# Patient Record
Sex: Female | Born: 1960 | Race: White | Hispanic: No | Marital: Single | Smoking: Never smoker
Health system: Southern US, Community
[De-identification: ages and names within clinical notes are randomized; demographics above are authoritative.]

## PROBLEM LIST (undated history)

## (undated) DIAGNOSIS — E7800 Pure hypercholesterolemia, unspecified: Secondary | ICD-10-CM

## (undated) DIAGNOSIS — E119 Type 2 diabetes mellitus without complications: Secondary | ICD-10-CM

## (undated) DIAGNOSIS — I10 Essential (primary) hypertension: Secondary | ICD-10-CM

## (undated) DIAGNOSIS — K219 Gastro-esophageal reflux disease without esophagitis: Secondary | ICD-10-CM

## (undated) HISTORY — PX: TUBAL LIGATION: SHX77

## (undated) HISTORY — PX: ADENOIDECTOMY: SUR15

## (undated) HISTORY — PX: TONSILLECTOMY: SUR1361

## (undated) HISTORY — PX: DILATION AND CURETTAGE OF UTERUS: SHX78

---

## 2010-12-04 ENCOUNTER — Emergency Department: Payer: Self-pay | Admitting: Internal Medicine

## 2012-10-22 ENCOUNTER — Emergency Department: Payer: Self-pay | Admitting: Internal Medicine

## 2012-10-22 LAB — URINALYSIS, COMPLETE
Glucose,UR: NEGATIVE mg/dL (ref 0–75)
Ph: 9 (ref 4.5–8.0)
RBC,UR: 126 /HPF (ref 0–5)
Squamous Epithelial: 7
WBC UR: 59 /HPF (ref 0–5)

## 2012-10-31 DIAGNOSIS — Z0000 Encounter for general adult medical examination without abnormal findings: Secondary | ICD-10-CM | POA: Insufficient documentation

## 2012-10-31 DIAGNOSIS — E669 Obesity, unspecified: Secondary | ICD-10-CM | POA: Insufficient documentation

## 2012-10-31 DIAGNOSIS — Z1211 Encounter for screening for malignant neoplasm of colon: Secondary | ICD-10-CM | POA: Insufficient documentation

## 2012-11-16 ENCOUNTER — Ambulatory Visit: Payer: Self-pay

## 2013-06-13 DIAGNOSIS — J309 Allergic rhinitis, unspecified: Secondary | ICD-10-CM | POA: Insufficient documentation

## 2013-06-13 DIAGNOSIS — Z8679 Personal history of other diseases of the circulatory system: Secondary | ICD-10-CM | POA: Insufficient documentation

## 2013-06-13 DIAGNOSIS — I10 Essential (primary) hypertension: Secondary | ICD-10-CM | POA: Insufficient documentation

## 2013-06-16 ENCOUNTER — Ambulatory Visit: Payer: Self-pay | Admitting: Family Medicine

## 2013-12-26 DIAGNOSIS — G479 Sleep disorder, unspecified: Secondary | ICD-10-CM | POA: Insufficient documentation

## 2014-01-24 DIAGNOSIS — C44310 Basal cell carcinoma of skin of unspecified parts of face: Secondary | ICD-10-CM | POA: Insufficient documentation

## 2014-02-05 ENCOUNTER — Ambulatory Visit: Payer: Self-pay | Admitting: Family Medicine

## 2014-03-24 ENCOUNTER — Emergency Department: Payer: Self-pay | Admitting: Emergency Medicine

## 2014-03-27 LAB — BETA STREP CULTURE(ARMC)

## 2014-05-08 DIAGNOSIS — Z85828 Personal history of other malignant neoplasm of skin: Secondary | ICD-10-CM | POA: Insufficient documentation

## 2015-08-16 ENCOUNTER — Other Ambulatory Visit: Payer: Self-pay | Admitting: Family Medicine

## 2015-08-16 DIAGNOSIS — Z1231 Encounter for screening mammogram for malignant neoplasm of breast: Secondary | ICD-10-CM

## 2015-12-06 ENCOUNTER — Ambulatory Visit: Payer: Self-pay

## 2015-12-16 ENCOUNTER — Encounter: Payer: Self-pay | Admitting: Radiology

## 2015-12-16 ENCOUNTER — Ambulatory Visit
Admission: RE | Admit: 2015-12-16 | Discharge: 2015-12-16 | Disposition: A | Discharge status: Home / Self Care | Payer: BLUE CROSS/BLUE SHIELD | Source: Ambulatory Visit | Attending: Family Medicine | Admitting: Family Medicine

## 2015-12-16 DIAGNOSIS — Z1231 Encounter for screening mammogram for malignant neoplasm of breast: Secondary | ICD-10-CM | POA: Diagnosis present

## 2016-11-25 ENCOUNTER — Other Ambulatory Visit: Payer: Self-pay | Admitting: Family Medicine

## 2016-11-25 DIAGNOSIS — Z1231 Encounter for screening mammogram for malignant neoplasm of breast: Secondary | ICD-10-CM

## 2017-03-05 ENCOUNTER — Ambulatory Visit
Admission: RE | Admit: 2017-03-05 | Discharge: 2017-03-05 | Disposition: A | Discharge status: Home / Self Care | Payer: BLUE CROSS/BLUE SHIELD | Source: Ambulatory Visit | Attending: Family Medicine | Admitting: Family Medicine

## 2017-03-05 DIAGNOSIS — Z1231 Encounter for screening mammogram for malignant neoplasm of breast: Secondary | ICD-10-CM | POA: Insufficient documentation

## 2018-01-27 ENCOUNTER — Other Ambulatory Visit: Payer: Self-pay | Admitting: Family Medicine

## 2018-01-27 DIAGNOSIS — Z1231 Encounter for screening mammogram for malignant neoplasm of breast: Secondary | ICD-10-CM

## 2018-03-08 ENCOUNTER — Ambulatory Visit
Admission: RE | Admit: 2018-03-08 | Discharge: 2018-03-08 | Disposition: A | Discharge status: Home / Self Care | Payer: BLUE CROSS/BLUE SHIELD | Source: Ambulatory Visit | Attending: Family Medicine | Admitting: Family Medicine

## 2018-03-08 DIAGNOSIS — Z1231 Encounter for screening mammogram for malignant neoplasm of breast: Secondary | ICD-10-CM | POA: Diagnosis present

## 2019-02-14 ENCOUNTER — Other Ambulatory Visit: Payer: Self-pay | Admitting: Family Medicine

## 2019-02-14 DIAGNOSIS — Z1231 Encounter for screening mammogram for malignant neoplasm of breast: Secondary | ICD-10-CM

## 2019-03-31 ENCOUNTER — Ambulatory Visit
Admission: RE | Admit: 2019-03-31 | Discharge: 2019-03-31 | Disposition: A | Discharge status: Home / Self Care | Payer: BLUE CROSS/BLUE SHIELD | Source: Ambulatory Visit | Attending: Family Medicine | Admitting: Family Medicine

## 2019-03-31 DIAGNOSIS — Z1231 Encounter for screening mammogram for malignant neoplasm of breast: Secondary | ICD-10-CM | POA: Diagnosis not present

## 2019-12-08 ENCOUNTER — Ambulatory Visit
Admission: EM | Admit: 2019-12-08 | Discharge: 2019-12-08 | Disposition: A | Discharge status: Home / Self Care | Payer: BLUE CROSS/BLUE SHIELD | Attending: Emergency Medicine | Admitting: Emergency Medicine

## 2019-12-08 ENCOUNTER — Other Ambulatory Visit: Payer: Self-pay

## 2019-12-08 DIAGNOSIS — J029 Acute pharyngitis, unspecified: Secondary | ICD-10-CM | POA: Diagnosis present

## 2019-12-08 DIAGNOSIS — R059 Cough, unspecified: Secondary | ICD-10-CM | POA: Insufficient documentation

## 2019-12-08 DIAGNOSIS — B349 Viral infection, unspecified: Secondary | ICD-10-CM | POA: Insufficient documentation

## 2019-12-08 DIAGNOSIS — Z20822 Contact with and (suspected) exposure to covid-19: Secondary | ICD-10-CM | POA: Diagnosis not present

## 2019-12-08 HISTORY — DX: Essential (primary) hypertension: I10

## 2019-12-08 HISTORY — DX: Type 2 diabetes mellitus without complications: E11.9

## 2019-12-08 HISTORY — DX: Pure hypercholesterolemia, unspecified: E78.00

## 2019-12-08 HISTORY — DX: Gastro-esophageal reflux disease without esophagitis: K21.9

## 2019-12-08 LAB — GROUP A STREP BY PCR: Group A Strep by PCR: NOT DETECTED

## 2019-12-08 MED ORDER — IPRATROPIUM BROMIDE 0.06 % NA SOLN: 2.0000 | Freq: Four times a day (QID) | NASAL | 0 refills | Status: DC

## 2019-12-08 MED ORDER — BENZONATATE 100 MG PO CAPS: 200.0000 mg | ORAL_CAPSULE | Freq: Three times a day (TID) | ORAL | 0 refills | Status: AC | PRN

## 2019-12-08 NOTE — Discharge Instructions (Addendum)
I will call with the results of the strep test & antibiotics if your strep test is positive.  As discussed, if you test positive for Covid I will call you tomorrow or the next day to inform you and I can also refer you to the infusion clinic for antibody therapy.  URI/COLD SYMPTOMS: Your exam today is consistent with a viral illness. Antibiotics are not indicated at this time. Use medications as directed, including cough syrup, nasal saline, and decongestants. Your symptoms should improve over the next few days and resolve within 7-10 days. Increase rest and fluids. F/u if symptoms worsen or predominate such as sore throat, ear pain, productive cough, shortness of breath, or if you develop high fevers or worsening fatigue over the next several days.    You have received COVID testing today either for positive exposure, concerning symptoms that could be related to COVID infection, screening purposes, or re-testing after confirmed positive.  Your test obtained today checks for active viral infection in the last 1-2 weeks. If your test is negative now, you can still test positive later. So, if you do develop symptoms you should either get re-tested and/or isolate x 10 days. Please follow CDC guidelines.  While Rapid antigen tests come back in 15-20 minutes, send out PCR/molecular test results typically come back within 24 hours. In the mean time, if you are symptomatic, assume this could be a positive test and treat/monitor yourself as if you do have COVID.   We will call with test results. Please download the MyChart app and set up a profile to access test results.   If symptomatic, go home and rest. Push fluids. Take Tylenol as needed for discomfort. Gargle warm salt water. Throat lozenges. Take Mucinex DM or Robitussin for cough. Humidifier in bedroom to ease coughing. Warm showers. Also review the COVID handout for more information.  COVID-19 INFECTION: The incubation period of COVID-19 is  approximately 14 days after exposure, with most symptoms developing in roughly 4-5 days. Symptoms may range in severity from mild to critically severe. Roughly 80% of those infected will have mild symptoms. People of any age may become infected with COVID-19 and have the ability to transmit the virus. The most common symptoms include: fever, fatigue, cough, body aches, headaches, sore throat, nasal congestion, shortness of breath, nausea, vomiting, diarrhea, changes in smell and/or taste.    COURSE OF ILLNESS Some patients may begin with mild disease which can progress quickly into critical symptoms. If your symptoms are worsening please call ahead to the Emergency Department and proceed there for further treatment. Recovery time appears to be roughly 1-2 weeks for mild symptoms and 3-6 weeks for severe disease.   GO IMMEDIATELY TO ER FOR FEVER YOU ARE UNABLE TO GET DOWN WITH TYLENOL, BREATHING PROBLEMS, CHEST PAIN, FATIGUE, LETHARGY, INABILITY TO EAT OR DRINK, ETC  QUARANTINE AND ISOLATION: To help decrease the spread of COVID-19 please remain isolated if you have COVID infection or are highly suspected to have COVID infection. This means -stay home and isolate to one room in the home if you live with others. Do not share a bed or bathroom with others while ill, sanitize and wipe down all countertops and keep common areas clean and disinfected. You may discontinue isolation if you have a mild case and are asymptomatic 10 days after symptom onset as long as you have been fever free >24 hours without having to take Motrin or Tylenol. If your case is more severe (meaning you develop pneumonia  or are admitted in the hospital), you may have to isolate longer.   If you have been in close contact (within 6 feet) of someone diagnosed with COVID 19, you are advised to quarantine in your home for 14 days as symptoms can develop anywhere from 2-14 days after exposure to the virus. If you develop symptoms, you  must  isolate.  Most current guidelines for COVID after exposure -isolate 10 days if you ARE NOT tested for COVID as long as symptoms do not develop -isolate 7 days if you are tested and remain asymptomatic -You do not necessarily need to be tested for COVID if you have + exposure and        develop   symptoms. Just isolate at home x10 days from symptom onset During this global pandemic, CDC advises to practice social distancing, try to stay at least 44ft away from others at all times. Wear a face covering. Wash and sanitize your hands regularly and avoid going anywhere that is not necessary.  KEEP IN MIND THAT THE COVID TEST IS NOT 100% ACCURATE AND YOU SHOULD STILL DO EVERYTHING TO PREVENT POTENTIAL SPREAD OF VIRUS TO OTHERS (WEAR MASK, WEAR GLOVES, WASH HANDS AND SANITIZE REGULARLY). IF INITIAL TEST IS NEGATIVE, THIS MAY NOT MEAN YOU ARE DEFINITELY NEGATIVE. MOST ACCURATE TESTING IS DONE 5-7 DAYS AFTER EXPOSURE.   It is not advised by CDC to get re-tested after receiving a positive COVID test since you can still test positive for weeks to months after you have already cleared the virus.   *If you have not been vaccinated for COVID, I strongly suggest you consider getting vaccinated as long as there are no contraindications.

## 2019-12-08 NOTE — ED Triage Notes (Signed)
Pt reports being very sleepy two days ago and sleeping all day.  Yesterday developed sore throat and low grade temp.  Today reports sore throat, HA, cough, sinus congestion.  Took Tylenol around 0930 this morning.

## 2019-12-08 NOTE — ED Triage Notes (Signed)
Tried to reach patient for Triage. Phone # is not working.

## 2019-12-08 NOTE — ED Provider Notes (Signed)
[facility]    CSN: [id number] Arrival date & time: 12/08/19  1437      History   Chief Complaint Chief Complaint  Patient presents with  . Sore Throat    HPI Lauren Lyons is a 59 y.o. female presenting for 2-day history of feeling fatigued.  She states that she has had a sore throat and low-grade temperature since yesterday.  Today she admits to headaches, cough, sinus and nasal congestion and continued sore throat.  She states that she has taken Tylenol this morning.  Patient says that someone at her workplace was recently out with Covid.  Is fully vaccinated for Covid.  Patient has taken Tylenol for symptoms, but no other medications.  She denies chest pain, breathing difficulty, abdominal pain, nausea, vomiting, diarrhea.  Patient medical history significant for diabetes and hypertension.  Patient has no other complaints or concerns today.  HPI  Past Medical History:  Diagnosis Date  . Diabetes mellitus without complication (HCC)   . GERD (gastroesophageal reflux disease)   . High cholesterol   . Hypertension     There are no problems to display for this patient.   Past Surgical History:  Procedure Laterality Date  . ADENOIDECTOMY    . CESAREAN SECTION    . DILATION AND CURETTAGE OF UTERUS    . TONSILLECTOMY      OB History   No obstetric history on file.      Home Medications    Prior to Admission medications   Medication Sig Start Date End Date Taking? Authorizing Provider  atorvastatin (LIPITOR) 40 MG tablet Take 40 mg by mouth daily.   Yes [provider]  cetirizine (ZYRTEC) 10 MG tablet Take 10 mg by mouth daily.   Yes [provider]  lisinopril-hydrochlorothiazide (ZESTORETIC) 20-12.5 MG tablet Take 1 tablet by mouth daily.   Yes [provider]  Multiple Vitamin (MULTIVITAMIN) tablet Take 1 tablet by mouth daily.   Yes [provider]  omeprazole (PRILOSEC) 20 MG capsule Take 20 mg by mouth daily.    Yes [provider]  sitaGLIPtin (JANUVIA) 50 MG tablet Take 50 mg by mouth daily.   Yes [provider]  venlafaxine XR (EFFEXOR-XR) 150 MG 24 hr capsule Take 150 mg by mouth daily with breakfast.   Yes [provider]  benzonatate (TESSALON) 100 MG capsule Take 2 capsules (200 mg total) by mouth 3 (three) times daily as needed for up to 10 days for cough. 12/08/19 12/18/19  Eusebio Friendly B, PA-C  ipratropium (ATROVENT) 0.06 % nasal spray Place 2 sprays into both nostrils 4 (four) times daily for 10 days. 12/08/19 12/18/19  Shirlee Latch, PA-C    Family History Family History  Problem Relation Age of Onset  . Breast cancer Maternal Grandmother   . Breast cancer Cousin   . Hypertension Mother   . Diabetes Father     Social History Social History   Tobacco Use  . Smoking status: Never Smoker  . Smokeless tobacco: Never Used  Vaping Use  . Vaping Use: Never used  Substance Use Topics  . Alcohol use: Never  . Drug use: Never     Allergies   Patient has no known allergies.   Review of Systems Review of Systems  Constitutional: Positive for fatigue. Negative for chills, diaphoresis and fever.  HENT: Positive for congestion, rhinorrhea, sinus pressure and sore throat. Negative for ear pain and sinus pain.   Respiratory: Positive for cough. Negative  for shortness of breath.   Gastrointestinal: Negative for abdominal pain, nausea and vomiting.  Musculoskeletal: Negative for arthralgias and myalgias.  Skin: Negative for rash.  Neurological: Positive for headaches. Negative for weakness.  Hematological: Negative for adenopathy.     Physical Exam Triage Vital Signs ED Triage Vitals  Enc Vitals Group     BP 12/08/19 1724 (!) 155/86     Pulse Rate 12/08/19 1724 92     Resp 12/08/19 1724 18     Temp 12/08/19 1724 98.7 F (37.1 C)     Temp Source 12/08/19 1724 Oral     SpO2 12/08/19 1724 96 %     Weight --      Height --      Head Circumference  --      Peak Flow --      Pain Score 12/08/19 1728 5     Pain Loc --      Pain Edu? --      Excl. in GC? --    No data found.  Updated Vital Signs BP (!) 155/86 (BP Location: Left Arm)   Pulse 92   Temp 98.7 F (37.1 C) (Oral)   Resp 18   LMP 11/25/2015 (Approximate)   SpO2 96%       Physical Exam Vitals and nursing note reviewed.  Constitutional:      General: She is not in acute distress.    Appearance: Normal appearance. She is well-developed. She is obese. She is not ill-appearing or toxic-appearing.  HENT:     Head: Normocephalic and atraumatic.     Right Ear: Tympanic membrane and ear canal normal.     Left Ear: Tympanic membrane and ear canal normal.     Nose: Congestion present.     Mouth/Throat:     Mouth: Mucous membranes are moist.     Pharynx: Oropharynx is clear. Posterior oropharyngeal erythema (copius clear foamy PND) present.  Eyes:     General: No scleral icterus.       Right eye: No discharge.        Left eye: No discharge.     Conjunctiva/sclera: Conjunctivae normal.  Cardiovascular:     Rate and Rhythm: Normal rate and regular rhythm.     Heart sounds: Normal heart sounds.  Pulmonary:     Effort: Pulmonary effort is normal. No respiratory distress.     Breath sounds: Normal breath sounds.  Musculoskeletal:     Cervical back: Neck supple.  Skin:    General: Skin is dry.  Neurological:     General: No focal deficit present.     Mental Status: She is alert. Mental status is at baseline.     Motor: No weakness.     Gait: Gait normal.  Psychiatric:        Mood and Affect: Mood normal.        Behavior: Behavior normal.        Thought Content: Thought content normal.      UC Treatments / Results  Labs (all labs ordered are listed, but only abnormal results are displayed) Labs Reviewed  GROUP A STREP BY PCR  SARS CORONAVIRUS 2 (TAT 6-24 HRS)    EKG   Radiology No results found.  Procedures Procedures (including critical care  time)  Medications Ordered in UC Medications - No data to display  Initial Impression / Assessment and Plan / UC Course  I have reviewed the triage vital signs and the nursing notes.  Pertinent  labs & imaging results that were available during my care of the patient were reviewed by me and considered in my medical decision making (see chart for details).   Rapid strep is negative.  Covid test sent.  Discussed with patient access results.  CDC guidelines, isolation protocol and ED precautions discussed with patient and positive.  Patient will be a candidate for the MAB therapy and will be interested in this.  Advised patient I will call her if she is positive and make the referral to infusion center.  At this time I advised her to go home and rest, increase fluid intake.  She can continue Tylenol as needed.  Also sent Tessalon Perles for cough and Atrovent nasal spray since she has copious postnasal drainage.  Advised her to follow-up with our clinic as needed for any new or worsening symptoms.   Final Clinical Impressions(s) / UC Diagnoses   Final diagnoses:  Viral illness  Sore throat  Cough  Exposure to COVID-19 virus     Discharge Instructions     I will call with the results of the strep test & antibiotics if your strep test is positive.  As discussed, if you test positive for Covid I will call you tomorrow or the next day to inform you and I can also refer you to the infusion clinic for antibody therapy.  URI/COLD SYMPTOMS: Your exam today is consistent with a viral illness. Antibiotics are not indicated at this time. Use medications as directed, including cough syrup, nasal saline, and decongestants. Your symptoms should improve over the next few days and resolve within 7-10 days. Increase rest and fluids. F/u if symptoms worsen or predominate such as sore throat, ear pain, productive cough, shortness of breath, or if you develop high fevers or worsening fatigue over the next several  days.    You have received COVID testing today either for positive exposure, concerning symptoms that could be related to COVID infection, screening purposes, or re-testing after confirmed positive.  Your test obtained today checks for active viral infection in the last 1-2 weeks. If your test is negative now, you can still test positive later. So, if you do develop symptoms you should either get re-tested and/or isolate x 10 days. Please follow CDC guidelines.  While Rapid antigen tests come back in 15-20 minutes, send out PCR/molecular test results typically come back within 24 hours. In the mean time, if you are symptomatic, assume this could be a positive test and treat/monitor yourself as if you do have COVID.   We will call with test results. Please download the MyChart app and set up a profile to access test results.   If symptomatic, go home and rest. Push fluids. Take Tylenol as needed for discomfort. Gargle warm salt water. Throat lozenges. Take Mucinex DM or Robitussin for cough. Humidifier in bedroom to ease coughing. Warm showers. Also review the COVID handout for more information.  COVID-19 INFECTION: The incubation period of COVID-19 is approximately 14 days after exposure, with most symptoms developing in roughly 4-5 days. Symptoms may range in severity from mild to critically severe. Roughly 80% of those infected will have mild symptoms. People of any age may become infected with COVID-19 and have the ability to transmit the virus. The most common symptoms include: fever, fatigue, cough, body aches, headaches, sore throat, nasal congestion, shortness of breath, nausea, vomiting, diarrhea, changes in smell and/or taste.    COURSE OF ILLNESS Some patients may begin with mild disease which can  progress quickly into critical symptoms. If your symptoms are worsening please call ahead to the Emergency Department and proceed there for further treatment. Recovery time appears to be roughly 1-2  weeks for mild symptoms and 3-6 weeks for severe disease.   GO IMMEDIATELY TO ER FOR FEVER YOU ARE UNABLE TO GET DOWN WITH TYLENOL, BREATHING PROBLEMS, CHEST PAIN, FATIGUE, LETHARGY, INABILITY TO EAT OR DRINK, ETC  QUARANTINE AND ISOLATION: To help decrease the spread of COVID-19 please remain isolated if you have COVID infection or are highly suspected to have COVID infection. This means -stay home and isolate to one room in the home if you live with others. Do not share a bed or bathroom with others while ill, sanitize and wipe down all countertops and keep common areas clean and disinfected. You may discontinue isolation if you have a mild case and are asymptomatic 10 days after symptom onset as long as you have been fever free >24 hours without having to take Motrin or Tylenol. If your case is more severe (meaning you develop pneumonia or are admitted in the hospital), you may have to isolate longer.   If you have been in close contact (within 6 feet) of someone diagnosed with COVID 19, you are advised to quarantine in your home for 14 days as symptoms can develop anywhere from 2-14 days after exposure to the virus. If you develop symptoms, you  must isolate.  Most current guidelines for COVID after exposure -isolate 10 days if you ARE NOT tested for COVID as long as symptoms do not develop -isolate 7 days if you are tested and remain asymptomatic -You do not necessarily need to be tested for COVID if you have + exposure and        develop   symptoms. Just isolate at home x10 days from symptom onset During this global pandemic, CDC advises to practice social distancing, try to stay at least 55ft away from others at all times. Wear a face covering. Wash and sanitize your hands regularly and avoid going anywhere that is not necessary.  KEEP IN MIND THAT THE COVID TEST IS NOT 100% ACCURATE AND YOU SHOULD STILL DO EVERYTHING TO PREVENT POTENTIAL SPREAD OF VIRUS TO OTHERS (WEAR MASK, WEAR GLOVES, WASH  HANDS AND SANITIZE REGULARLY). IF INITIAL TEST IS NEGATIVE, THIS MAY NOT MEAN YOU ARE DEFINITELY NEGATIVE. MOST ACCURATE TESTING IS DONE 5-7 DAYS AFTER EXPOSURE.   It is not advised by CDC to get re-tested after receiving a positive COVID test since you can still test positive for weeks to months after you have already cleared the virus.   *If you have not been vaccinated for COVID, I strongly suggest you consider getting vaccinated as long as there are no contraindications.      ED Prescriptions    Medication Sig Dispense Auth. Provider   benzonatate (TESSALON) 100 MG capsule Take 2 capsules (200 mg total) by mouth 3 (three) times daily as needed for up to 10 days for cough. 30 capsule Eusebio Friendly B, PA-C   ipratropium (ATROVENT) 0.06 % nasal spray Place 2 sprays into both nostrils 4 (four) times daily for 10 days. 15 mL Shirlee Latch, PA-C     PDMP not reviewed this encounter.   Shirlee Latch, PA-C 12/08/19 1820

## 2019-12-09 LAB — SARS CORONAVIRUS 2 (TAT 6-24 HRS): SARS Coronavirus 2: NEGATIVE

## 2020-05-13 DIAGNOSIS — E1142 Type 2 diabetes mellitus with diabetic polyneuropathy: Secondary | ICD-10-CM | POA: Insufficient documentation

## 2020-06-06 DIAGNOSIS — R42 Dizziness and giddiness: Secondary | ICD-10-CM | POA: Insufficient documentation

## 2020-08-26 IMAGING — MG DIGITAL SCREENING BILAT W/ TOMO W/ CAD
8 of 14 series · 8 of 40 positions shown · non-contrast
Comparison: Previous exam(s).

ACR Breast Density Category a: The breast tissue is almost entirely
fatty.

CLINICAL DATA: Screening.

EXAM:
DIGITAL SCREENING BILATERAL MAMMOGRAM WITH TOMO AND CAD

[R MLO synth-2D (1 of 2)]
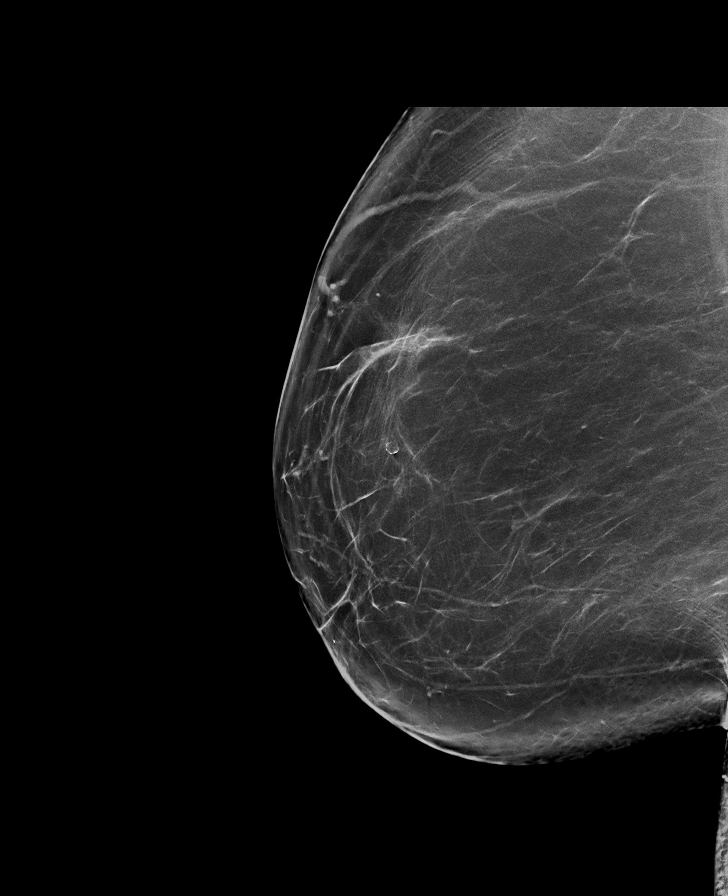

[R CC synth-2D]
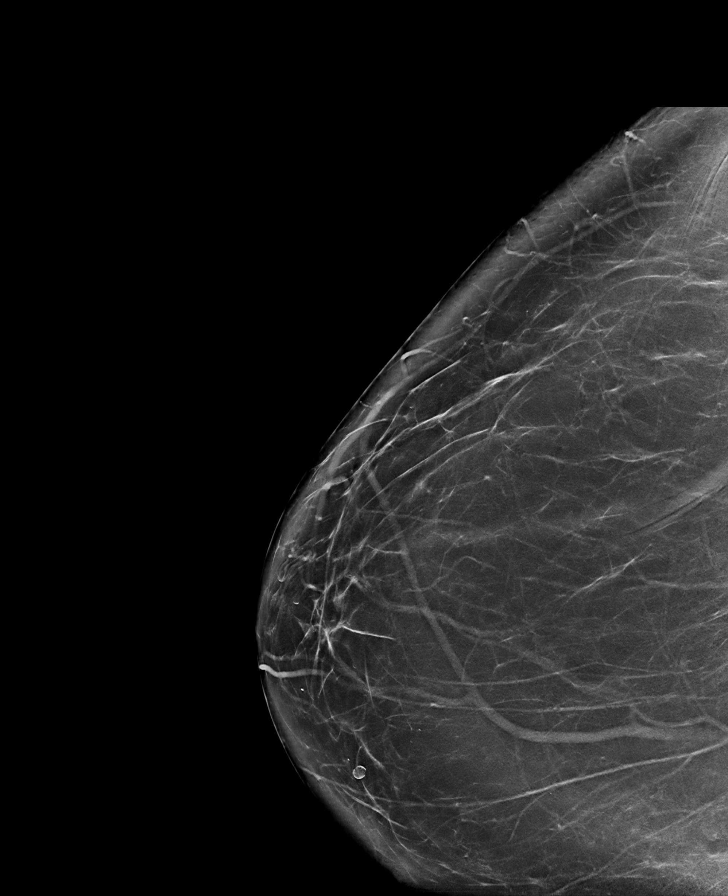

[L MLO synth-2D (1 of 2)]
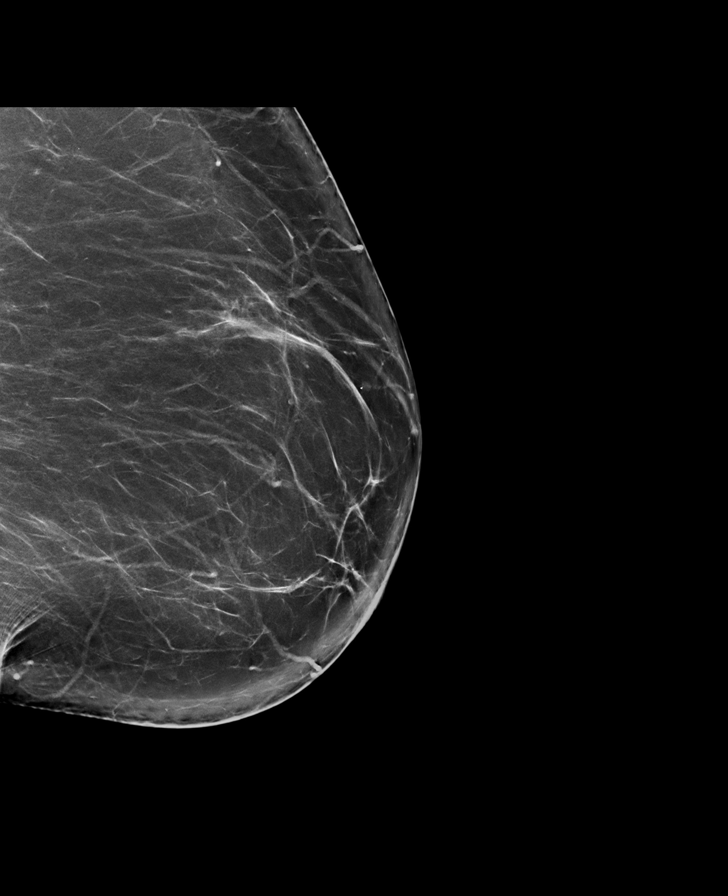

[L MLO synth-2D (2 of 2)]
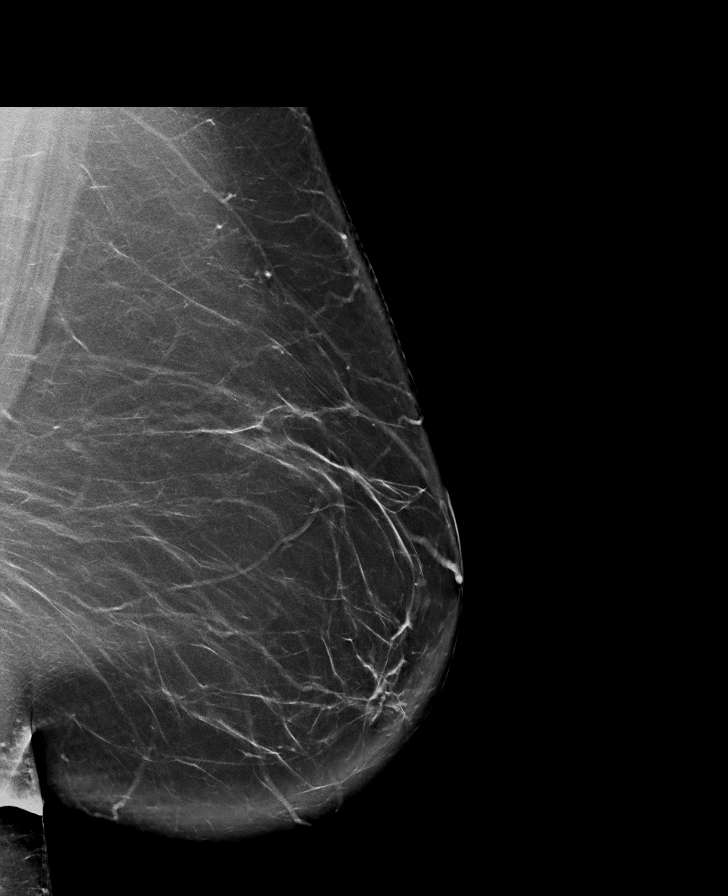

[L CC synth-2D (1 of 2)]
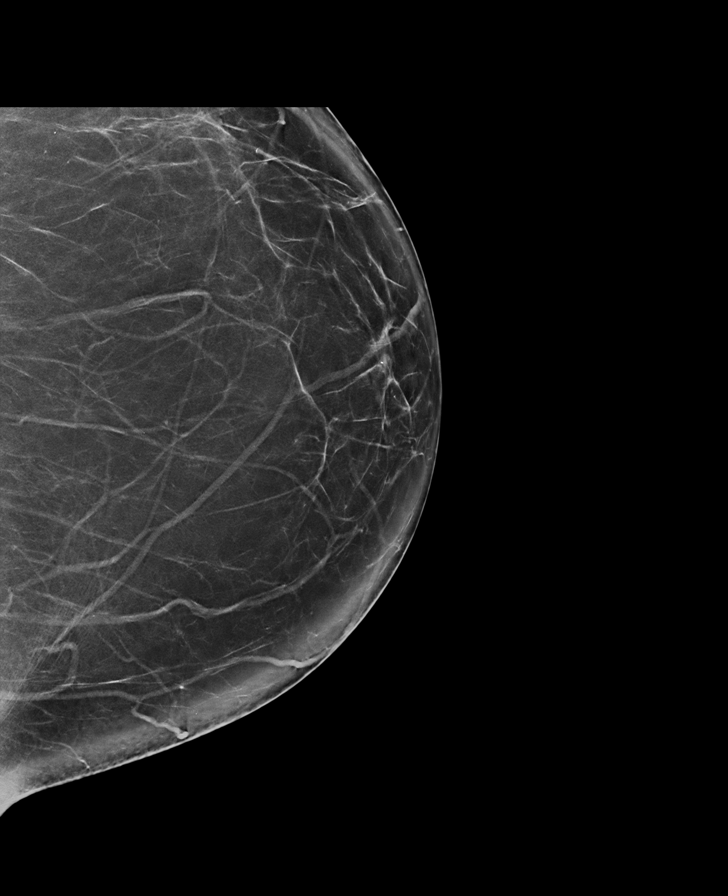

[L CC synth-2D (2 of 2)]
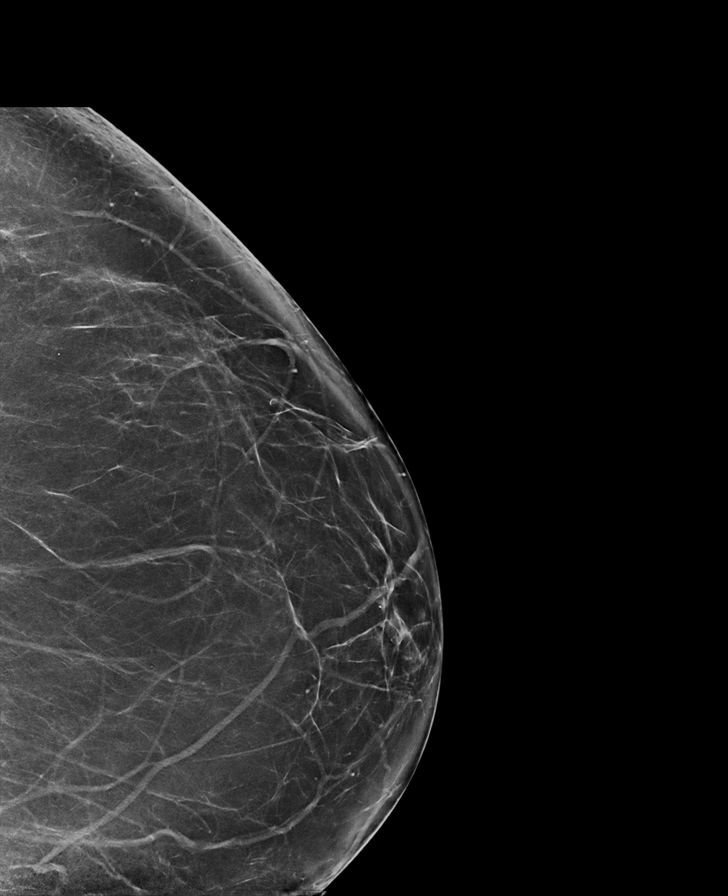

[R MLO synth-2D (2 of 2)]
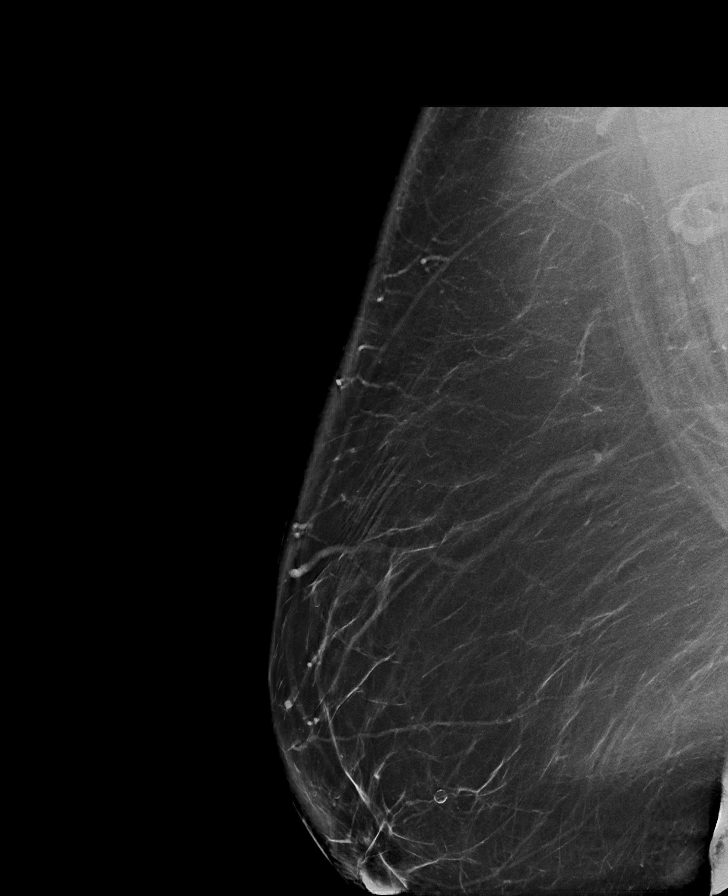

[L CC tomo · tomo slice 44/87.0]
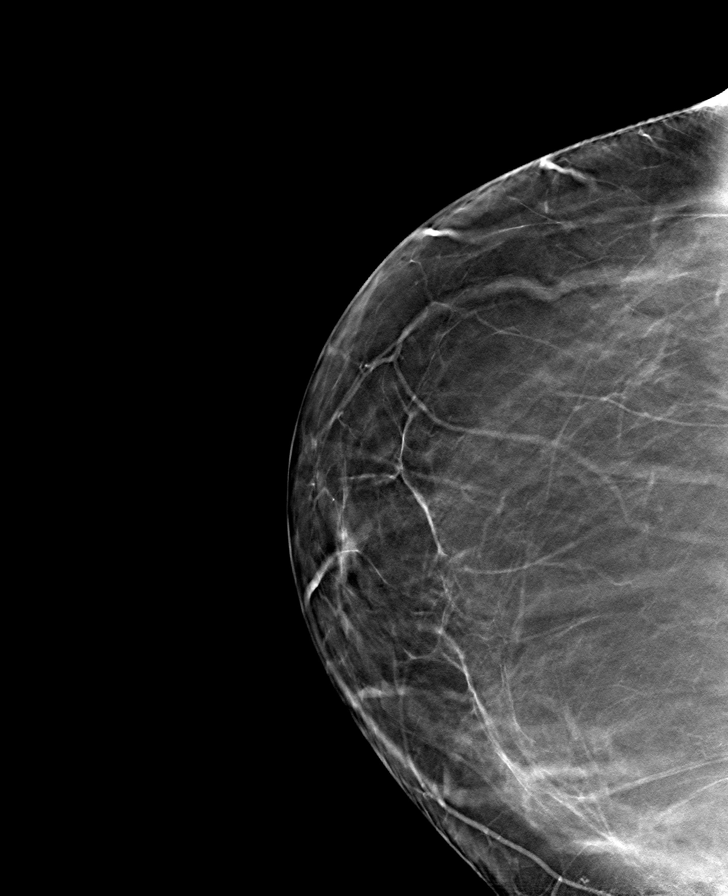

[8 of 40 positions shown; findings below may reference images not displayed]

FINDINGS: There are no findings suspicious for malignancy. Images were
processed with CAD.
IMPRESSION: No mammographic evidence of malignancy. A result letter of this
screening mammogram will be mailed directly to the patient.

RECOMMENDATION:
Screening mammogram in one year. (Code:[id number]-Q-[id number])

BI-RADS CATEGORY  1: Negative.

## 2021-03-19 ENCOUNTER — Other Ambulatory Visit: Payer: Self-pay | Admitting: Nurse Practitioner

## 2021-03-19 DIAGNOSIS — Z1231 Encounter for screening mammogram for malignant neoplasm of breast: Secondary | ICD-10-CM

## 2021-07-18 DIAGNOSIS — H9201 Otalgia, right ear: Secondary | ICD-10-CM | POA: Insufficient documentation

## 2021-11-26 DIAGNOSIS — N950 Postmenopausal bleeding: Secondary | ICD-10-CM | POA: Insufficient documentation

## 2021-11-26 DIAGNOSIS — R350 Frequency of micturition: Secondary | ICD-10-CM | POA: Insufficient documentation

## 2021-11-27 ENCOUNTER — Other Ambulatory Visit: Payer: Self-pay | Admitting: Nurse Practitioner

## 2021-11-27 ENCOUNTER — Other Ambulatory Visit: Payer: Self-pay | Admitting: Family Medicine

## 2021-11-27 DIAGNOSIS — Z1231 Encounter for screening mammogram for malignant neoplasm of breast: Secondary | ICD-10-CM

## 2021-11-27 DIAGNOSIS — N950 Postmenopausal bleeding: Secondary | ICD-10-CM

## 2021-12-02 ENCOUNTER — Ambulatory Visit
Admission: RE | Admit: 2021-12-02 | Discharge: 2021-12-02 | Disposition: A | Discharge status: Home / Self Care | Payer: 59 | Source: Ambulatory Visit | Attending: Family Medicine | Admitting: Family Medicine

## 2021-12-02 DIAGNOSIS — Z1231 Encounter for screening mammogram for malignant neoplasm of breast: Secondary | ICD-10-CM | POA: Insufficient documentation

## 2021-12-10 ENCOUNTER — Ambulatory Visit: Payer: 59

## 2023-04-21 DIAGNOSIS — J111 Influenza due to unidentified influenza virus with other respiratory manifestations: Secondary | ICD-10-CM | POA: Insufficient documentation

## 2023-04-21 DIAGNOSIS — R059 Cough, unspecified: Secondary | ICD-10-CM | POA: Insufficient documentation

## 2023-06-14 DIAGNOSIS — L989 Disorder of the skin and subcutaneous tissue, unspecified: Secondary | ICD-10-CM | POA: Insufficient documentation

## 2023-06-14 DIAGNOSIS — R945 Abnormal results of liver function studies: Secondary | ICD-10-CM | POA: Insufficient documentation

## 2023-07-01 DIAGNOSIS — R195 Other fecal abnormalities: Secondary | ICD-10-CM | POA: Insufficient documentation

## 2023-07-05 ENCOUNTER — Other Ambulatory Visit: Payer: Self-pay | Admitting: Nurse Practitioner

## 2023-07-05 DIAGNOSIS — Z1231 Encounter for screening mammogram for malignant neoplasm of breast: Secondary | ICD-10-CM

## 2023-07-13 ENCOUNTER — Telehealth: Payer: Self-pay

## 2023-07-13 ENCOUNTER — Encounter: Payer: Self-pay | Admitting: Dermatology

## 2023-07-13 ENCOUNTER — Ambulatory Visit: Admitting: Dermatology

## 2023-07-13 DIAGNOSIS — L821 Other seborrheic keratosis: Secondary | ICD-10-CM | POA: Diagnosis not present

## 2023-07-13 DIAGNOSIS — Z85828 Personal history of other malignant neoplasm of skin: Secondary | ICD-10-CM

## 2023-07-13 DIAGNOSIS — D485 Neoplasm of uncertain behavior of skin: Secondary | ICD-10-CM

## 2023-07-13 DIAGNOSIS — C44519 Basal cell carcinoma of skin of other part of trunk: Secondary | ICD-10-CM | POA: Diagnosis not present

## 2023-07-13 DIAGNOSIS — D492 Neoplasm of unspecified behavior of bone, soft tissue, and skin: Secondary | ICD-10-CM | POA: Diagnosis not present

## 2023-07-13 DIAGNOSIS — C4491 Basal cell carcinoma of skin, unspecified: Secondary | ICD-10-CM

## 2023-07-13 HISTORY — DX: Basal cell carcinoma of skin, unspecified: C44.91

## 2023-07-13 NOTE — Progress Notes (Signed)
   New Patient Visit   Subjective  Lauren Lyons is a 63 y.o. female who presents for the following: spot at right chest, present for about 1 year. Patient with hx of BCC. Also an irritated spot at left eye.   The patient has spots, moles and lesions to be evaluated, some may be new or changing and the patient may have concern these could be cancer.   The following portions of the chart were reviewed this encounter and updated as appropriate: medications, allergies, medical history  Review of Systems:  No other skin or systemic complaints except as noted in HPI or Assessment and Plan.  Objective  Well appearing patient in no apparent distress; mood and affect are within normal limits.   A focused examination was performed of the following areas: Chest, face, back  Relevant exam findings are noted in the Assessment and Plan.  right chest 13 mm pink plaque with central ulceration  left periorbital 6 mm brown pedunculated papule SK vs Acrochordon   Assessment & Plan     NEOPLASM OF UNCERTAIN BEHAVIOR OF SKIN (2) right chest Skin / nail biopsy Type of biopsy: tangential   Informed consent: discussed and consent obtained   Timeout: patient name, date of birth, surgical site, and procedure verified   Procedure prep:  Patient was prepped and draped in usual sterile fashion Prep type:  Isopropyl alcohol Anesthesia: the lesion was anesthetized in a standard fashion   Anesthetic:  1% lidocaine w/ epinephrine 1-100,000 buffered w/ 8.4% NaHCO3 Instrument used: DermaBlade   Hemostasis achieved with: pressure and aluminum chloride   Outcome: patient tolerated procedure well   Post-procedure details: sterile dressing applied and wound care instructions given   Dressing type: bandage and petrolatum   Specimen 1 - Surgical pathology Differential Diagnosis: r/o BCC  Check Margins: No 13 mm pink plaque with central ulceration left periorbital Skin / nail biopsy Type of biopsy:  tangential   Informed consent: discussed and consent obtained   Timeout: patient name, date of birth, surgical site, and procedure verified   Procedure prep:  Patient was prepped and draped in usual sterile fashion Prep type:  Isopropyl alcohol Anesthesia: the lesion was anesthetized in a standard fashion   Anesthetic:  1% lidocaine w/ epinephrine 1-100,000 buffered w/ 8.4% NaHCO3 Instrument used: DermaBlade   Hemostasis achieved with: pressure and aluminum chloride   Outcome: patient tolerated procedure well   Post-procedure details: sterile dressing applied and wound care instructions given   Dressing type: bandage and petrolatum   Specimen 2 - Surgical pathology Differential Diagnosis: SK vs Acrochordon  Check Margins: No 6 mm brown pedunculated papule  SEBORRHEIC KERATOSES    SEBORRHEIC KERATOSIS - Stuck-on, waxy, tan-brown papules and/or plaques on back - Benign-appearing - Discussed benign etiology and prognosis. - Observe - Call for any changes   Return for pending bx results.  Kerstin Peeling, RMA, am acting as scribe for Harris Liming, MD .   Documentation: I have reviewed the above documentation for accuracy and completeness, and I agree with the above.  Harris Liming, MD

## 2023-07-13 NOTE — Telephone Encounter (Signed)
 PT  said she has been trying to reach us  but every time she calls she get transferred to a voicemail.

## 2023-07-13 NOTE — Telephone Encounter (Signed)
 Per pt she received a letter stating she need to  contact us  to schedule a colonoscopy. I look but didn't see a referral nor a letter. PT would like a call.

## 2023-07-13 NOTE — Patient Instructions (Signed)

## 2023-07-20 ENCOUNTER — Ambulatory Visit: Payer: Self-pay | Admitting: Dermatology

## 2023-07-20 LAB — SURGICAL PATHOLOGY

## 2023-07-21 ENCOUNTER — Ambulatory Visit
Admission: RE | Admit: 2023-07-21 | Discharge: 2023-07-21 | Disposition: A | Discharge status: Home / Self Care | Source: Ambulatory Visit | Attending: Nurse Practitioner | Admitting: Nurse Practitioner

## 2023-07-21 DIAGNOSIS — Z1231 Encounter for screening mammogram for malignant neoplasm of breast: Secondary | ICD-10-CM | POA: Insufficient documentation

## 2023-07-21 NOTE — Telephone Encounter (Signed)
-----   Message from [facility] sent at 07/20/2023  5:53 PM EDT ----- Diagnosis: 1. Skin, right chest :       BASAL CELL CARCINOMA, NODULAR AND INFILTRATIVE PATTERNS, ULCERATED        2. Skin, left periorbital :       SEBORRHEIC KERATOSIS, POLYPOID    Please call to share diagnosis and discuss treatment options.  CHEST Explanation: your biopsy shows basal cell skin cancers in the second layer of the skin. This is the most common kind of skin cancer and is caused by damage from sun exposure. Basal cell skin cancers almost never spread beyond the skin, so they are not dangerous to your overall health. However, they will continue to grow, can bleed, cause nonhealing wounds, and disrupt nearby structures unless fully treated.   Treatment: Excision - you return for an hour long appointment in our clinic where we perform a skin surgery. We numb the site of the skin cancer and a safety margin of normal skin around it. We remove the full thickness of skin and close the wound with two layers of stitches. The sample is sent to the lab to check that the skin cancer was fully removed. Return one week later to have wound checked and surface stitches removed. Surgical wound leaves a line scar. Approximately 95% cure rate. Risk of recurrence, bleeding, infection, pain, injury to nearby structures, hypertrophic scar.  EYEBROW Benign thickening of the top layer of skin (seborrheic keratosis). No treatment needed

## 2023-07-21 NOTE — Telephone Encounter (Signed)
 Called patient. N/A. LMOVM to return our call.

## 2023-07-21 NOTE — Telephone Encounter (Signed)
 I ask pt to have PCP office fax me the referral. Per pt she will ask her PCP to fax to [phone].

## 2023-07-21 NOTE — Telephone Encounter (Signed)
 Pt would like to schedule a colonoscopy. I explained we needed a referral. I did not see one in system.

## 2023-07-22 NOTE — Telephone Encounter (Signed)
Patient advised of BX result and surgery scheduled. aw

## 2023-08-04 ENCOUNTER — Telehealth: Payer: Self-pay

## 2023-08-04 ENCOUNTER — Other Ambulatory Visit: Payer: Self-pay

## 2023-08-04 DIAGNOSIS — R195 Other fecal abnormalities: Secondary | ICD-10-CM

## 2023-08-04 DIAGNOSIS — Z1211 Encounter for screening for malignant neoplasm of colon: Secondary | ICD-10-CM

## 2023-08-04 MED ORDER — NA SULFATE-K SULFATE-MG SULF 17.5-3.13-1.6 GM/177ML PO SOLN: 1.0000 | Freq: Once | ORAL | 0 refills | Status: AC

## 2023-08-04 NOTE — Telephone Encounter (Signed)
 Gastroenterology Pre-Procedure Review  Request Date: 09/01/22 Requesting Physician: Dr. Cornel Diesel  PATIENT REVIEW QUESTIONS: The patient responded to the following health history questions as indicated:    1. Are you having any GI issues? Positive cologuard, 1st colonoscopy 2. Do you have a personal history of Polyps? no 3. Do you have a family history of Colon Cancer or Polyps?mother colon polyps 4. Diabetes Mellitus?yes takes ozempic and has been advised to stop 7 days prior to colonoscopy.  For questions regarding blood sugars during preparation contact pcp's office in advance to discuss 5. Joint replacements in the past 12 months?no 6. Major health problems in the past 3 months?no 7. Any artificial heart valves, MVP, or defibrillator?no    MEDICATIONS & ALLERGIES:    Patient reports the following regarding taking any anticoagulation/antiplatelet therapy:   Plavix, Coumadin, Eliquis, Xarelto, Lovenox, Pradaxa, Brilinta, or Effient? no Aspirin? no  Patient confirms/reports the following medications:  Current Outpatient Medications  Medication Sig Dispense Refill   Blood Glucose Monitoring Suppl (ACCU-CHEK GUIDE) w/Device KIT ACCU-CHEK GUIDE w/Device KIT     Blood Glucose Monitoring Suppl (CONTOUR NEXT MONITOR) w/Device KIT CONTOUR NEXT MONITOR w/Device KIT     Cholecalciferol (VITAMIN D-3) 25 MCG (1000 UT) CAPS VITAMIN D-3 25 MCG (1000 UT) CAPS     melatonin 3 MG TABS tablet MELATONIN 3 MG TABS     OZEMPIC, 0.25 OR 0.5 MG/DOSE, 2 MG/3ML SOPN      atorvastatin (LIPITOR) 40 MG tablet Take 40 mg by mouth daily.     cetirizine (ZYRTEC) 10 MG tablet Take 10 mg by mouth daily.     ipratropium (ATROVENT ) 0.06 % nasal spray Place 2 sprays into both nostrils 4 (four) times daily for 10 days. 15 mL 0   lisinopril-hydrochlorothiazide (ZESTORETIC) 20-12.5 MG tablet Take 1 tablet by mouth daily.     Multiple Vitamin (MULTIVITAMIN) tablet Take 1 tablet by mouth daily.     omeprazole (PRILOSEC) 20  MG capsule Take 20 mg by mouth daily.     venlafaxine XR (EFFEXOR-XR) 150 MG 24 hr capsule Take 150 mg by mouth daily with breakfast.     No current facility-administered medications for this visit.    Patient confirms/reports the following allergies:  No Known Allergies  No orders of the defined types were placed in this encounter.   AUTHORIZATION INFORMATION Primary Insurance: 1D#: Group #:  Secondary Insurance: 1D#: Group #:  SCHEDULE INFORMATION: Date:  Time: Location:

## 2023-08-23 ENCOUNTER — Telehealth: Payer: Self-pay

## 2023-08-23 NOTE — Telephone Encounter (Signed)
 Patient left a voicemail requesting that her colonoscopy prep be sent to [facility] as her usual pharmacy ([facility]) is out of stock. Her procedure is scheduled for next Wednesday. I returned the call and confirmed that we received her message. I asked the patient which [facility] location she preferred, and she requested the prescription be sent to [facility] at [address] [place], [place] [address].

## 2023-08-25 ENCOUNTER — Encounter: Payer: Self-pay | Admitting: Dermatology

## 2023-08-25 ENCOUNTER — Ambulatory Visit: Admitting: Dermatology

## 2023-08-25 DIAGNOSIS — C44519 Basal cell carcinoma of skin of other part of trunk: Secondary | ICD-10-CM | POA: Diagnosis not present

## 2023-08-25 MED ORDER — MUPIROCIN 2 % EX OINT: 1.0000 | TOPICAL_OINTMENT | Freq: Every day | CUTANEOUS | 0 refills | Status: AC

## 2023-08-25 NOTE — Progress Notes (Signed)
   Follow-Up Visit   Subjective  Lauren Lyons is a 63 y.o. female who presents for the following: Excision of bx proven BCC at right chest  The following portions of the chart were reviewed this encounter and updated as appropriate: medications, allergies, medical history  Review of Systems:  No other skin or systemic complaints except as noted in HPI or Assessment and Plan.  Objective  Well appearing patient in no apparent distress; mood and affect are within normal limits.  A focused examination was performed of the following areas: chest Relevant physical exam findings are noted in the Assessment and Plan.   right chest Pink bx site  Assessment & Plan   BASAL CELL CARCINOMA (BCC) OF SKIN OF OTHER PART OF TORSO right chest Skin excision  Excision method:  elliptical Lesion length (cm):  1.4 Margin per side (cm):  0.4 Total excision diameter (cm):  2.2 Informed consent: discussed and consent obtained   Timeout: patient name, date of birth, surgical site, and procedure verified   Procedure prep:  Patient was prepped and draped in usual sterile fashion Prep type:  Chlorhexidine Anesthesia: the lesion was anesthetized in a standard fashion   Anesthetic:  1% lidocaine w/ epinephrine 1-100,000 buffered w/ 8.4% NaHCO3 (27 ml) Instrument used: #15 blade   Hemostasis achieved with: suture, pressure and electrodesiccation   Outcome: patient tolerated procedure well with no complications    Skin repair Complexity:  Intermediate Final length (cm):  6.2 Informed consent: discussed and consent obtained   Timeout: patient name, date of birth, surgical site, and procedure verified   Procedure prep:  Patient was prepped and draped in usual sterile fashion Prep type:  Chlorhexidine Anesthesia: the lesion was anesthetized in a standard fashion   Anesthetic:  1% lidocaine w/ epinephrine 1-100,000 buffered w/ 8.4% NaHCO3 Reason for type of repair: reduce tension to allow closure,  reduce the risk of dehiscence, infection, and necrosis, reduce subcutaneous dead space and avoid a hematoma, allow closure of the large defect and preserve normal anatomy   Undermining: edges could be approximated without difficulty   Subcutaneous layers (deep stitches):  Suture size:  4-0 Suture type: Monocryl (poliglecaprone 25)   Stitches:  Buried vertical mattress Fine/surface layer approximation (top stitches):  Suture size:  5-0 Suture type: Prolene (polypropylene)   Stitches comment:  Running locked Suture removal (days):  7 Hemostasis achieved with: suture, pressure and electrodesiccation Outcome: patient tolerated procedure well with no complications   Post-procedure details: sterile dressing applied and wound care instructions given   Dressing type: petrolatum, bandage and pressure dressing   Specimen 1 - Surgical pathology Differential Diagnosis: bx proven BASAL CELL CARCINOMA, NODULAR AND INFILTRATIVE PATTERNS, ULCERATED  Check Margins: yes [id number] Superior tag   Return in about 1 week (around 09/01/2023) for Suture Removal.  Kerstin Peeling, RMA, am acting as scribe for Harris Liming, MD .   Documentation: I have reviewed the above documentation for accuracy and completeness, and I agree with the above.  Harris Liming, MD

## 2023-08-25 NOTE — Patient Instructions (Signed)

## 2023-08-26 ENCOUNTER — Telehealth: Payer: Self-pay

## 2023-08-26 NOTE — Telephone Encounter (Signed)
Patient doing fine after yesterdays surgery. Lauren Reddick S., RMA 

## 2023-08-27 ENCOUNTER — Ambulatory Visit: Payer: Self-pay | Admitting: Dermatology

## 2023-08-27 LAB — SURGICAL PATHOLOGY

## 2023-08-30 NOTE — Telephone Encounter (Signed)
 Called patient to discuss pathology results and get update on surgical wound. N/A. LMOVM to return our call if any questions or problems.

## 2023-08-30 NOTE — Telephone Encounter (Signed)
-----   Message from [place] sent at 08/27/2023  5:38 PM EDT ----- Diagnosis right chest :       RESIDUAL BASAL CELL CARCINOMA, NODULAR-INFILTRATIVE TYPE, MARGINS FREE   Please call to share that excision was clear of basal cell skin cancer and get update on surgical wound. Thank you. ----- Message ----- From: Interface, Lab [facility] Sent: 08/27/2023  11:49 AM EDT To: Boneta Sharps, MD

## 2023-09-01 ENCOUNTER — Ambulatory Visit: Admitting: Certified Registered"

## 2023-09-01 ENCOUNTER — Other Ambulatory Visit: Payer: Self-pay

## 2023-09-01 ENCOUNTER — Ambulatory Visit
Admission: RE | Admit: 2023-09-01 | Discharge: 2023-09-01 | Disposition: A | Discharge status: Home / Self Care | Attending: General Surgery | Admitting: General Surgery

## 2023-09-01 ENCOUNTER — Encounter
Admission: RE | Disposition: A | Discharge status: Home / Self Care | Payer: Self-pay | Source: Home / Self Care | Attending: General Surgery

## 2023-09-01 ENCOUNTER — Encounter: Payer: Self-pay | Admitting: General Surgery

## 2023-09-01 DIAGNOSIS — I10 Essential (primary) hypertension: Secondary | ICD-10-CM | POA: Diagnosis not present

## 2023-09-01 DIAGNOSIS — Z7985 Long-term (current) use of injectable non-insulin antidiabetic drugs: Secondary | ICD-10-CM | POA: Insufficient documentation

## 2023-09-01 DIAGNOSIS — E119 Type 2 diabetes mellitus without complications: Secondary | ICD-10-CM | POA: Diagnosis not present

## 2023-09-01 DIAGNOSIS — K219 Gastro-esophageal reflux disease without esophagitis: Secondary | ICD-10-CM | POA: Insufficient documentation

## 2023-09-01 DIAGNOSIS — E66813 Obesity, class 3: Secondary | ICD-10-CM | POA: Diagnosis not present

## 2023-09-01 DIAGNOSIS — K5730 Diverticulosis of large intestine without perforation or abscess without bleeding: Secondary | ICD-10-CM | POA: Diagnosis not present

## 2023-09-01 DIAGNOSIS — Z1211 Encounter for screening for malignant neoplasm of colon: Secondary | ICD-10-CM | POA: Insufficient documentation

## 2023-09-01 DIAGNOSIS — R195 Other fecal abnormalities: Secondary | ICD-10-CM

## 2023-09-01 DIAGNOSIS — Z6841 Body Mass Index (BMI) 40.0 and over, adult: Secondary | ICD-10-CM | POA: Insufficient documentation

## 2023-09-01 HISTORY — PX: COLONOSCOPY: SHX5424

## 2023-09-01 LAB — GLUCOSE, CAPILLARY: Glucose-Capillary: 201 mg/dL — ABNORMAL HIGH (ref 70–99)

## 2023-09-01 SURGERY — COLONOSCOPY
Anesthesia: General

## 2023-09-01 MED ORDER — PROPOFOL 10 MG/ML IV BOLUS
INTRAVENOUS | Status: DC | PRN
Start: 2023-09-01 — End: 2023-09-01
  Administered 2023-09-01: 30 mg via INTRAVENOUS
  Administered 2023-09-01 (×3): 40 mg via INTRAVENOUS

## 2023-09-01 MED ORDER — PROPOFOL 500 MG/50ML IV EMUL
INTRAVENOUS | Status: DC | PRN
  Administered 2023-09-01: 150 ug/kg/min via INTRAVENOUS

## 2023-09-01 MED ORDER — SODIUM CHLORIDE 0.9 % IV SOLN
INTRAVENOUS | Status: DC
  Administered 2023-09-01: 500 mL via INTRAVENOUS

## 2023-09-01 MED ORDER — PROPOFOL 1000 MG/100ML IV EMUL
INTRAVENOUS | Status: AC
  Filled 2023-09-01: qty 100

## 2023-09-01 NOTE — Transfer of Care (Signed)
 Immediate Anesthesia Transfer of Care Note  Patient: Lauren Lyons  Procedure(s) Performed: COLONOSCOPY  Patient Location: PACU  Anesthesia Type:General  Level of Consciousness: drowsy  Airway & Oxygen Therapy: Patient Spontanous Breathing  Post-op Assessment: Report given to RN, Post -op Vital signs reviewed and stable, and Patient moving all extremities X 4  Post vital signs: Reviewed and stable  Last Vitals:  Vitals Value Taken Time  BP 105/58 09/01/23 11:29  Temp    Pulse 85 09/01/23 11:29  Resp 14 09/01/23 11:29  SpO2 96 % 09/01/23 11:29  Vitals shown include unfiled device data.  Last Pain:  Vitals:   09/01/23 1129  TempSrc:   PainSc: Asleep      Patients Stated Pain Goal: 8 (09/01/23 1005)  Complications: No notable events documented.

## 2023-09-01 NOTE — Op Note (Addendum)
 [facility] Gastroenterology Patient Name: Lauren Lyons Procedure Date: 09/01/2023 10:40 AM MRN: [id number] Account #: [id number] Date of Birth: 14-Jul-1960 Admit Type: Outpatient Age: 63 Room: [facility] ENDO ROOM 1 Gender: Female Note Status: Supervisor Override Instrument Name: Arvis [id number] Procedure:             Colonoscopy Indications:           Screening for colorectal malignant neoplasm Providers:             Jayson KIDD. Marinda, MD Referring MD:          No Local Md, MD (Referring MD) Medicines:             See the Anesthesia note for documentation of the                         administered medications Complications:         No immediate complications. Estimated blood loss: None. Procedure:             Pre-Anesthesia Assessment:                        - Prior to the procedure, a History and Physical was                         performed, and patient medications and allergies were                         reviewed. The patient's tolerance of previous                         anesthesia was also reviewed. The risks and benefits                         of the procedure and the sedation options and risks                         were discussed with the patient. All questions were                         answered, and informed consent was obtained. Prior                         Anticoagulants: The patient has taken no anticoagulant                         or antiplatelet agents. ASA Grade Assessment: III - A                         patient with severe systemic disease. After reviewing                         the risks and benefits, the patient was deemed in                         satisfactory condition to undergo the procedure.                        After obtaining informed consent, the colonoscope was  passed under direct vision. Throughout the procedure,                         the patient's blood pressure, pulse, and oxygen                          saturations were monitored continuously. The                         Colonoscope was introduced through the anus and                         advanced to the the cecum, identified by appendiceal                         orifice and ileocecal valve. The colonoscopy was                         performed without difficulty. The quality of the bowel                         preparation was excellent. Findings:      The perianal and digital rectal examinations were normal.      Scattered medium-mouthed diverticula were found in the sigmoid colon.      The exam was otherwise without abnormality. Impression:            - Diverticulosis in the sigmoid colon.                        - The examination was otherwise normal.                        - No specimens collected. Recommendation:        - Discharge patient to home.                        - Repeat colonoscopy in 10 years for screening                         purposes. Procedure Code(s):     --- Professional ---                        [phone], Colonoscopy, flexible; diagnostic, including                         collection of specimen(s) by brushing or washing, when                         performed (separate procedure) CPT copyright 2022 American Medical Association. All rights reserved. The codes documented in this report are preliminary and upon coder review may  be revised to meet current compliance requirements. Jayson MALVA Endow, MD 09/01/2023 11:30:11 AM Number of Addenda: 0 Note Initiated On: 09/01/2023 10:40 AM Scope Withdrawal Time: 0 hours 11 minutes 19 seconds  Total Procedure Duration: 0 hours 34 minutes 9 seconds       [facility]

## 2023-09-01 NOTE — Discharge Instructions (Signed)
YOU HAD AN ENDOSCOPIC PROCEDURE TODAY: Refer to the procedure report that was given to you for any specific questions about what was found during the examination.  If the procedure report does not answer your questions, please call your gastroenterologist to clarify. ° °YOU SHOULD EXPECT: Some feelings of bloating in the abdomen. Passage of more gas than usual.  Walking can help get rid of the air that was put into your GI tract during the procedure and reduce the bloating. If you had a lower endoscopy (such as a colonoscopy or flexible sigmoidoscopy) you may notice spotting of blood in your stool or on the toilet paper.  ° °DIET: Your first meal following the procedure should be a light meal and then it is ok to progress to your normal diet.  A half-sandwich or bowl of soup is an example of a good first meal.  Heavy or fried foods are harder to digest and may make you feel nasueas or bloated.  Drink plenty of fluids but you should avoid alcoholic beverages for 24 hours. ° °ACTIVITY: Your care partner should take you home directly after the procedure.  You should plan to take it easy, moving slowly for the rest of the day.  You can resume normal activity the day after the procedure however you should NOT DRIVE, make legal decisions or use heavy machinery for 24 hours (because of the sedation medicines used during the test).   ° °SYMPTOMS TO REPORT IMMEDIATELY  °A gastroenterologist can be reached at any hour.  Please call your doctor's office for any of the following symptoms: ° °· Following lower endoscopy (colonoscopy, flexible sigmoidoscopy) ° Excessive amounts of blood in the stool ° Significant tenderness, worsening of abdominal pains ° Swelling of the abdomen that is new, acute ° Fever of 100° or higher °· Following upper endoscopy (EGD, EUS, ERCP) ° Vomiting of blood or coffee ground material ° New, significant abdominal pain ° New, significant chest pain or pain under the shoulder blades ° Painful or  persistently difficult swallowing ° New shortness of breath ° Black, tarry-looking stools ° °FOLLOW UP: °If any biopsies were taken you will be contacted by phone or by letter within the next 1-3 weeks.  Call your gastroenterologist if you have not heard about the biopsies in 3 weeks.  ° °Please also call your gastroenterologist's office with any specific questions about appointments or follow up tests. °

## 2023-09-01 NOTE — Anesthesia Preprocedure Evaluation (Addendum)
 Anesthesia Evaluation  Patient identified by MRN, date of birth, ID band Patient awake    Reviewed: Allergy & Precautions, NPO status , Patient's Chart, lab work & pertinent test results  History of Anesthesia Complications Negative for: history of anesthetic complications  Airway Mallampati: I   Neck ROM: Full    Dental  (+) Missing   Pulmonary neg pulmonary ROS   Pulmonary exam normal breath sounds clear to auscultation       Cardiovascular hypertension, Normal cardiovascular exam Rhythm:Regular Rate:Normal     Neuro/Psych negative neurological ROS     GI/Hepatic ,GERD  ,,  Endo/Other  diabetes, Type 2  Class 3 obesity  Renal/GU negative Renal ROS     Musculoskeletal   Abdominal   Peds  Hematology negative hematology ROS (+)   Anesthesia Other Findings Last dose of Ozempic 08/11/23.  Reproductive/Obstetrics                             Anesthesia Physical Anesthesia Plan  ASA: 3  Anesthesia Plan: General   Post-op Pain Management:    Induction: Intravenous  PONV Risk Score and Plan: 3 and Propofol infusion, TIVA and Treatment may vary due to age or medical condition  Airway Management Planned: Natural Airway  Additional Equipment:   Intra-op Plan:   Post-operative Plan:   Informed Consent: I have reviewed the patients History and Physical, chart, labs and discussed the procedure including the risks, benefits and alternatives for the proposed anesthesia with the patient or authorized representative who has indicated his/her understanding and acceptance.       Plan Discussed with: CRNA  Anesthesia Plan Comments: (LMA/GETA backup discussed.  Patient consented for risks of anesthesia including but not limited to:  - adverse reactions to medications - damage to eyes, teeth, lips or other oral mucosa - nerve damage due to positioning  - sore throat or hoarseness - damage  to heart, brain, nerves, lungs, other parts of body or loss of life  Informed patient about role of CRNA in peri- and intra-operative care.  Patient voiced understanding.)       Anesthesia Quick Evaluation

## 2023-09-01 NOTE — Anesthesia Postprocedure Evaluation (Signed)
 Anesthesia Post Note  Patient: SHATERIA PATERNOSTRO  Procedure(s) Performed: COLONOSCOPY  Patient location during evaluation: PACU Anesthesia Type: General Level of consciousness: awake and alert, oriented and patient cooperative Pain management: pain level controlled Vital Signs Assessment: post-procedure vital signs reviewed and stable Respiratory status: spontaneous breathing, nonlabored ventilation and respiratory function stable Cardiovascular status: blood pressure returned to baseline and stable Postop Assessment: adequate PO intake Anesthetic complications: no   No notable events documented.   Last Vitals:  Vitals:   09/01/23 1139 09/01/23 1149  BP: 104/66 132/72  Pulse: 78 69  Resp: 20 18  Temp:    SpO2: 98% 99%    Last Pain:  Vitals:   09/01/23 1149  TempSrc:   PainSc: 0-No pain                 Alfonso Ruths

## 2023-09-01 NOTE — Anesthesia Procedure Notes (Signed)
 Procedure Name: MAC Date/Time: 09/01/2023 10:49 AM  Performed by: Brandy Almarie BROCKS, CRNAPre-anesthesia Checklist: Patient identified, Emergency Drugs available, Suction available and Patient being monitored Oxygen Delivery Method: Nasal cannula

## 2023-09-01 NOTE — H&P (Signed)
 Primary Care Physician:  Thad Bucco, MD Primary Gastroenterologist:  Dr. Marinda  Pre-Procedure History & Physical: HPI:  Lauren Lyons is a 63 y.o. female is here for an colonoscopy.   Past Medical History:  Diagnosis Date   Basal cell carcinoma 07/13/2023   Right chest. Nodular and infiltrative pattern. Excision 08/25/23, residual BCC, margins free   Diabetes mellitus without complication (HCC)    GERD (gastroesophageal reflux disease)    High cholesterol    Hypertension     Past Surgical History:  Procedure Laterality Date   ADENOIDECTOMY     CESAREAN SECTION     X2   DILATION AND CURETTAGE OF UTERUS     TONSILLECTOMY     TUBAL LIGATION     1987    Prior to Admission medications   Medication Sig Start Date End Date Taking? Authorizing Provider  atorvastatin (LIPITOR) 40 MG tablet Take 40 mg by mouth daily.   Yes [provider]  cetirizine (ZYRTEC) 10 MG tablet Take 10 mg by mouth daily.   Yes [provider]  Cholecalciferol (VITAMIN D-3) 25 MCG (1000 UT) CAPS VITAMIN D-3 25 MCG (1000 UT) CAPS 10/04/20  Yes [provider]  cyanocobalamin (VITAMIN B12) 1000 MCG tablet Take 1,000 mcg by mouth daily.   Yes [provider]  lisinopril-hydrochlorothiazide (ZESTORETIC) 20-12.5 MG tablet Take 1 tablet by mouth daily.   Yes [provider]  melatonin 3 MG TABS tablet MELATONIN 3 MG TABS 08/25/18  Yes [provider]  mupirocin ointment (BACTROBAN) 2 % Apply 1 Application topically daily. 08/25/23  Yes Claudene Lehmann, MD  omeprazole (PRILOSEC) 20 MG capsule Take 20 mg by mouth daily.   Yes [provider]  venlafaxine XR (EFFEXOR-XR) 150 MG 24 hr capsule Take 150 mg by mouth daily with breakfast.   Yes [provider]  Blood Glucose Monitoring Suppl (ACCU-CHEK GUIDE) w/Device KIT ACCU-CHEK GUIDE w/Device KIT 04/17/23   [provider]  Multiple Vitamin (MULTIVITAMIN) tablet Take 1 tablet by mouth  daily.    [provider]  OZEMPIC, 0.25 OR 0.5 MG/DOSE, 2 MG/3ML [facility]  05/24/23   [provider]    Allergies as of 08/04/2023   (No Known Allergies)    Family History  Problem Relation Age of Onset   Breast cancer Maternal Grandmother    Breast cancer Cousin    Hypertension Mother    Diabetes Father     Social History   Socioeconomic History   Marital status: Single    Spouse name: Not on file   Number of children: Not on file   Years of education: Not on file   Highest education level: Not on file  Occupational History   Not on file  Tobacco Use   Smoking status: Never   Smokeless tobacco: Never  Vaping Use   Vaping status: Never Used  Substance and Sexual Activity   Alcohol use: Never   Drug use: Never   Sexual activity: Never  Other Topics Concern   Not on file  Social History Narrative   Not on file   Social Drivers of Health   Financial Resource Strain: Not on file  Food Insecurity: Not on file  Transportation Needs: Not on file  Physical Activity: Not on file  Stress: Not on file  Social Connections: Not on file  Intimate Partner Violence: Not on file    Review of Systems: See HPI, otherwise negative ROS  Physical Exam: BP 139/75   Pulse  80   Temp 98 F (36.7 C) (Oral)   Resp 11   Ht 5' 5 (1.651 m)   Wt 121 kg   LMP 11/25/2015 (Approximate)   SpO2 98%   BMI 44.40 kg/m  General:   Alert,  pleasant and cooperative in NAD Head:  Normocephalic and atraumatic. Neck:  Supple; no masses or thyromegaly. Lungs:  Clear throughout to auscultation.    Heart:  Regular rate and rhythm. Abdomen:  Soft, nontender and nondistended. Normal bowel sounds, without guarding, and without rebound.   Neurologic:  Alert and  oriented x4;  grossly normal neurologically.  Impression/Plan: Lauren Lyons is here for an colonoscopy to be performed for screening  Risks, benefits, limitations, and alternatives regarding  colonoscopy have been  reviewed with the patient.  Questions have been answered.  All parties agreeable.   Jayson MALVA Endow, MD  09/01/2023, 10:35 AM

## 2023-09-02 ENCOUNTER — Encounter: Payer: Self-pay | Admitting: General Surgery

## 2023-09-02 ENCOUNTER — Ambulatory Visit (INDEPENDENT_AMBULATORY_CARE_PROVIDER_SITE_OTHER): Admitting: Dermatology

## 2023-09-02 DIAGNOSIS — Z5189 Encounter for other specified aftercare: Secondary | ICD-10-CM

## 2023-09-02 DIAGNOSIS — Z48817 Encounter for surgical aftercare following surgery on the skin and subcutaneous tissue: Secondary | ICD-10-CM

## 2023-09-02 DIAGNOSIS — Z85828 Personal history of other malignant neoplasm of skin: Secondary | ICD-10-CM

## 2023-09-02 DIAGNOSIS — Z4802 Encounter for removal of sutures: Secondary | ICD-10-CM

## 2023-09-02 NOTE — Progress Notes (Signed)
   Follow-Up Visit   Subjective  Lauren Lyons is a 63 y.o. female who presents for the following: Suture removal at right chest  Pathology showed residual BCC, margins free.  The following portions of the chart were reviewed this encounter and updated as appropriate: medications, allergies, medical history  Review of Systems:  No other skin or systemic complaints except as noted in HPI or Assessment and Plan.  Objective  Well appearing patient in no apparent distress; mood and affect are within normal limits.  Areas Examined: Right chest Relevant physical exam findings are noted in the Assessment and Plan.    Assessment & Plan   VISIT FOR WOUND CHECK   ENCOUNTER FOR REMOVAL OF SUTURES   Encounter for Removal of Sutures - Incision site is clean, dry and intact. - Wound cleansed, sutures removed, wound cleansed and steri strips applied.  - Discussed pathology results showing residual BCC, margins free - Patient advised to keep steri-strips dry until they fall off. - Scars remodel for a full year. - Once steri-strips fall off, patient can apply over-the-counter silicone scar cream once to twice a day to help with scar remodeling if desired. - Patient advised to call with any concerns or if they notice any new or changing lesions.  Return in about 6 months (around 03/03/2024) for TBSE, HxBCC.  I, Jill Parcell, CMA, am acting as scribe for Boneta Sharps, MD.   Documentation: I have reviewed the above documentation for accuracy and completeness, and I agree with the above.  Boneta Sharps, MD

## 2023-09-02 NOTE — Patient Instructions (Signed)

## 2023-09-06 ENCOUNTER — Encounter: Payer: Self-pay | Admitting: Dermatology

## 2024-03-14 ENCOUNTER — Ambulatory Visit: Admitting: Dermatology
